# Patient Record
Sex: Female | Born: 1970
Health system: Southern US, Community
[De-identification: ages and names within clinical notes are randomized; demographics above are authoritative.]

---

## 2001-07-05 ENCOUNTER — Encounter: Admission: RE | Admit: 2001-07-05 | Discharge: 2001-08-07 | Payer: Self-pay | Admitting: Internal Medicine

## 2001-12-12 ENCOUNTER — Other Ambulatory Visit: Admission: RE | Admit: 2001-12-12 | Discharge: 2001-12-12 | Payer: Self-pay | Admitting: Obstetrics and Gynecology

## 2003-01-19 ENCOUNTER — Other Ambulatory Visit: Admission: RE | Admit: 2003-01-19 | Discharge: 2003-01-19 | Payer: Self-pay | Admitting: Obstetrics and Gynecology

## 2004-04-29 ENCOUNTER — Other Ambulatory Visit: Admission: RE | Admit: 2004-04-29 | Discharge: 2004-04-29 | Payer: Self-pay | Admitting: Obstetrics and Gynecology

## 2004-06-10 ENCOUNTER — Ambulatory Visit: Payer: Self-pay | Admitting: Internal Medicine

## 2005-06-15 ENCOUNTER — Other Ambulatory Visit: Admission: RE | Admit: 2005-06-15 | Discharge: 2005-06-15 | Payer: Self-pay | Admitting: Obstetrics and Gynecology

## 2005-08-31 ENCOUNTER — Ambulatory Visit: Payer: Self-pay | Admitting: Internal Medicine

## 2005-09-14 ENCOUNTER — Ambulatory Visit: Payer: Self-pay | Admitting: Internal Medicine

## 2005-10-23 ENCOUNTER — Ambulatory Visit: Payer: Self-pay | Admitting: Internal Medicine

## 2006-12-10 ENCOUNTER — Emergency Department (HOSPITAL_COMMUNITY): Admission: EM | Admit: 2006-12-10 | Discharge: 2006-12-10 | Payer: Self-pay | Admitting: Family Medicine

## 2007-02-18 DIAGNOSIS — E785 Hyperlipidemia, unspecified: Secondary | ICD-10-CM

## 2007-02-18 DIAGNOSIS — R519 Headache, unspecified: Secondary | ICD-10-CM | POA: Insufficient documentation

## 2007-02-18 DIAGNOSIS — F329 Major depressive disorder, single episode, unspecified: Secondary | ICD-10-CM

## 2007-02-18 DIAGNOSIS — R51 Headache: Secondary | ICD-10-CM | POA: Insufficient documentation

## 2013-07-09 ENCOUNTER — Other Ambulatory Visit: Payer: Self-pay | Admitting: Obstetrics and Gynecology

## 2013-07-09 DIAGNOSIS — R928 Other abnormal and inconclusive findings on diagnostic imaging of breast: Secondary | ICD-10-CM

## 2013-07-18 ENCOUNTER — Ambulatory Visit
Admission: RE | Admit: 2013-07-18 | Discharge: 2013-07-18 | Disposition: A | Discharge status: Home / Self Care | Payer: BC Managed Care – PPO | Source: Ambulatory Visit | Attending: Obstetrics and Gynecology | Admitting: Obstetrics and Gynecology

## 2013-07-18 DIAGNOSIS — R928 Other abnormal and inconclusive findings on diagnostic imaging of breast: Secondary | ICD-10-CM

## 2014-10-03 IMAGING — MG MM DIAGNOSTIC UNILATERAL L
2 series · 2 of 2 positions shown · non-contrast
Comparison: Previous examinations, including the screening
mammogram dated 07/02/2013.

CLINICAL DATA: Possible left breast mass at recent screening
mammography.

EXAM:
DIGITAL DIAGNOSTIC  LEFT MAMMOGRAM
ULTRASOUND LEFT BREAST

[L CC]
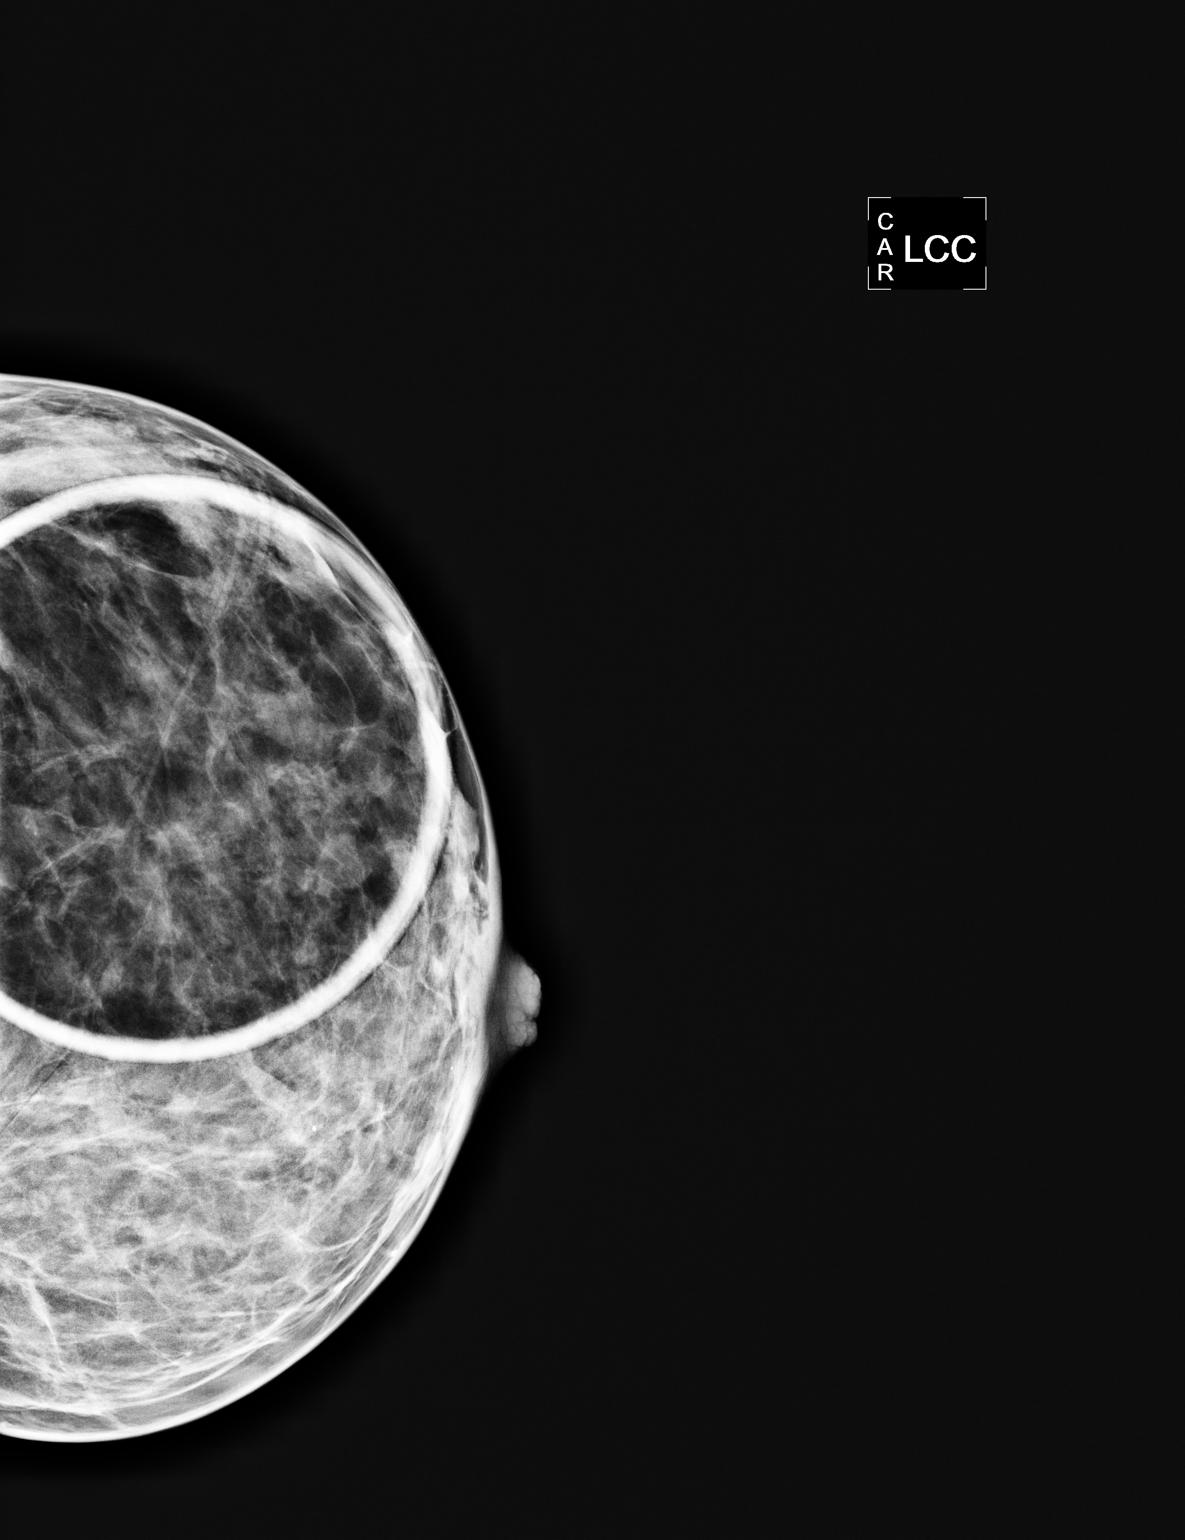

[L MLO]
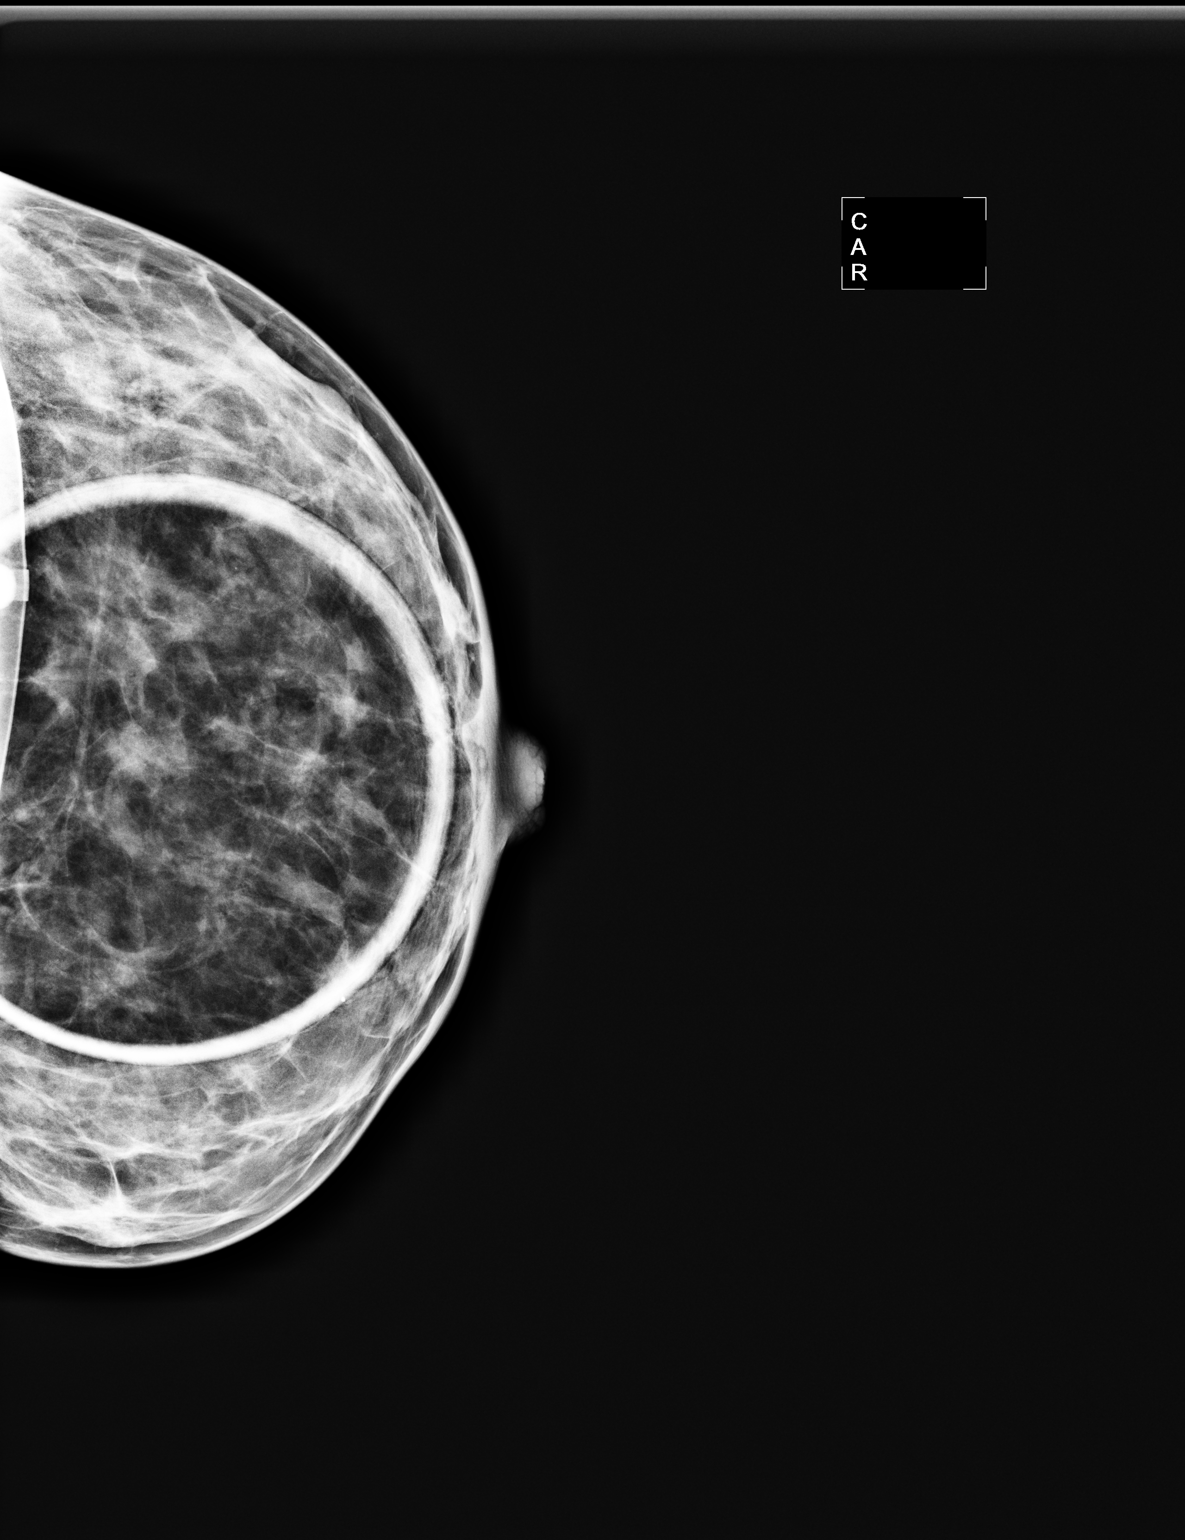

[2 of 2 positions shown; findings below may reference images not displayed]

ACR Breast Density Category c: The breast tissue is heterogeneously
dense, which may obscure small masses.
FINDINGS: Spot compression views of the left breast demonstrate normal
appearing fibroglandular tissue at the location of the recently
suspected mass in the lower outer breast.

On physical exam, no mass is palpable in the lower outer left
breast.

Ultrasound is performed, showing a 7 x 5 x 3 mm normal appearing
intramammary lymph node in the 4 o'clock position of the left
breast, 2 cm from the nipple. This corresponds to the recently
suspected mass seen on the screening mammogram.
IMPRESSION: Normal left breast intramammary lymph node. No evidence of
malignancy.

RECOMMENDATION:
Bilateral screening mammogram in 1 year.

I have discussed the findings and recommendations with the patient.
Results were also provided in writing at the conclusion of the
visit. If applicable, a reminder letter will be sent to the patient
regarding the next appointment.

BI-RADS CATEGORY  2: Benign finding(s).

## 2014-10-03 IMAGING — US US BREAST*L* LIMITED INC AXILLA
1 series · 6 of 6 positions shown · non-contrast
Comparison: Previous examinations, including the screening
mammogram dated 07/02/2013.

CLINICAL DATA: Possible left breast mass at recent screening
mammography.

EXAM:
DIGITAL DIAGNOSTIC  LEFT MAMMOGRAM
ULTRASOUND LEFT BREAST

[Series 1: us breast*left* limited inc axilla · 6 of 6 slices shown]
[im 1/6]
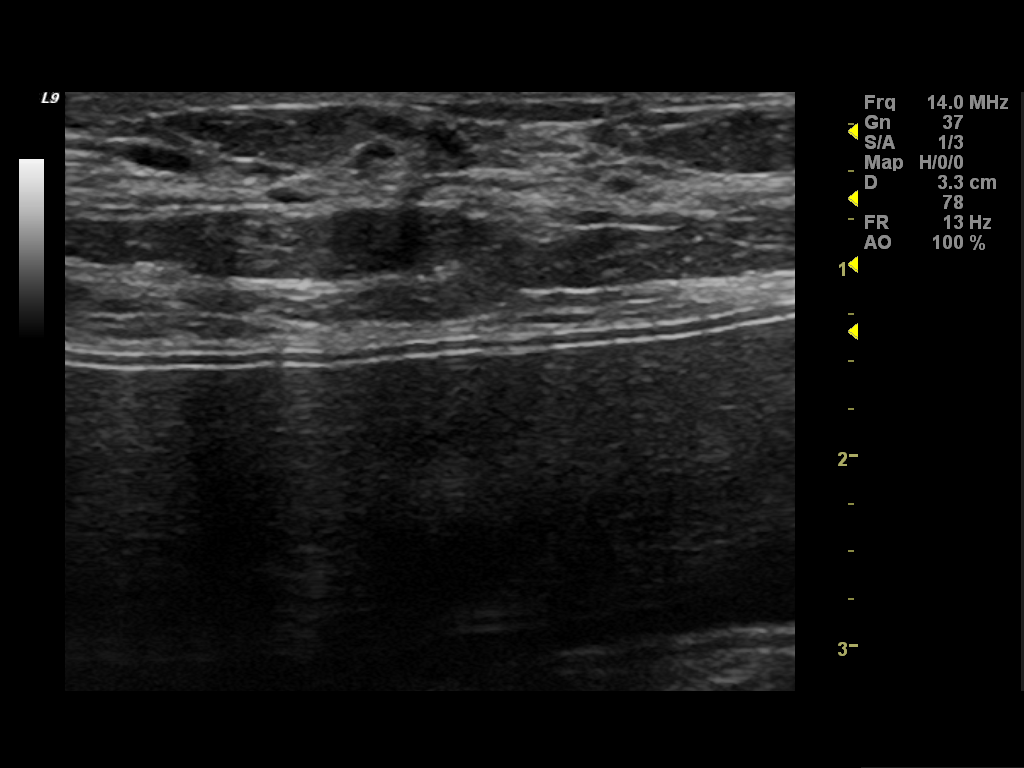
[im 2/6]
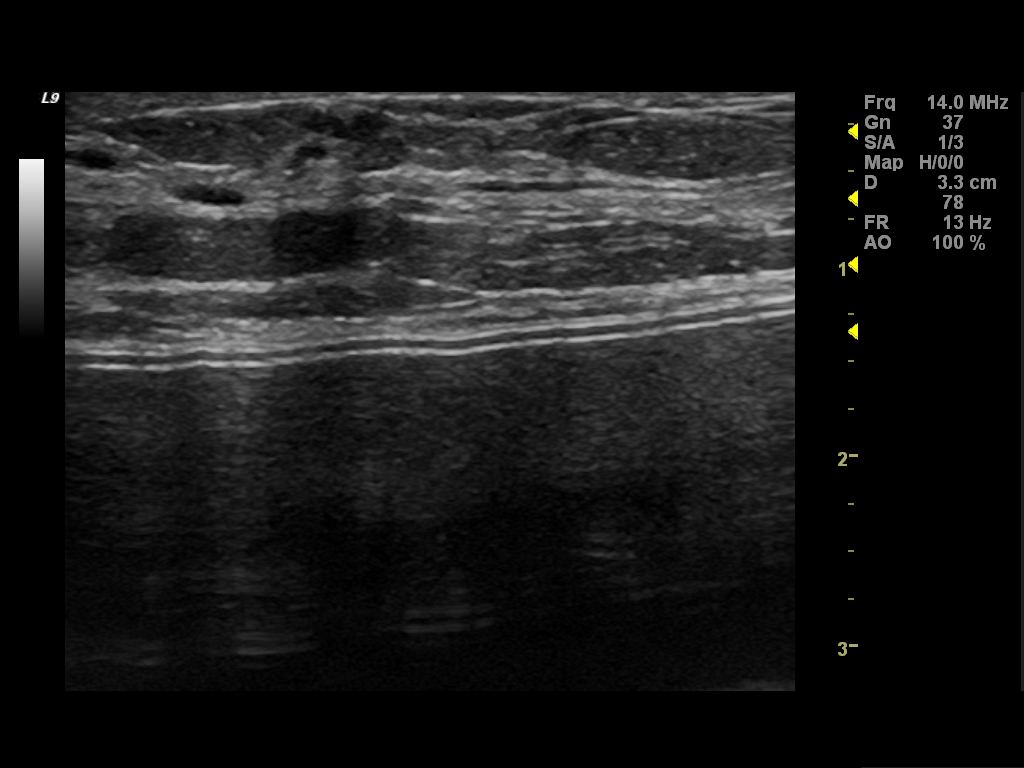
[im 3/6]
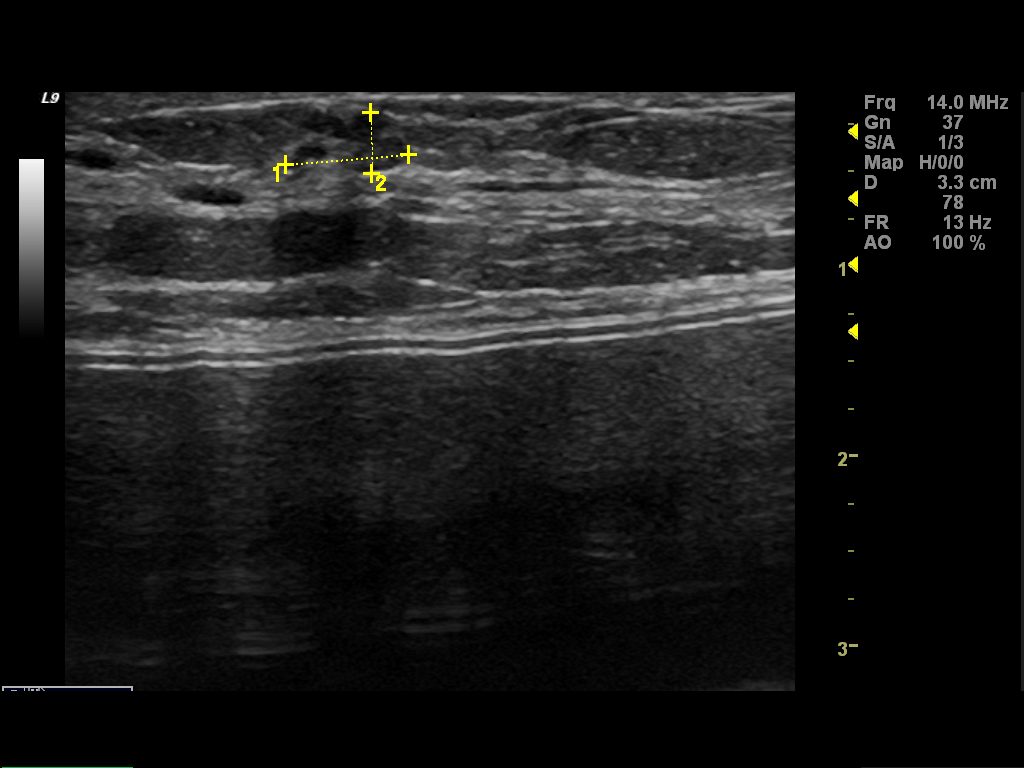
[im 4/6]
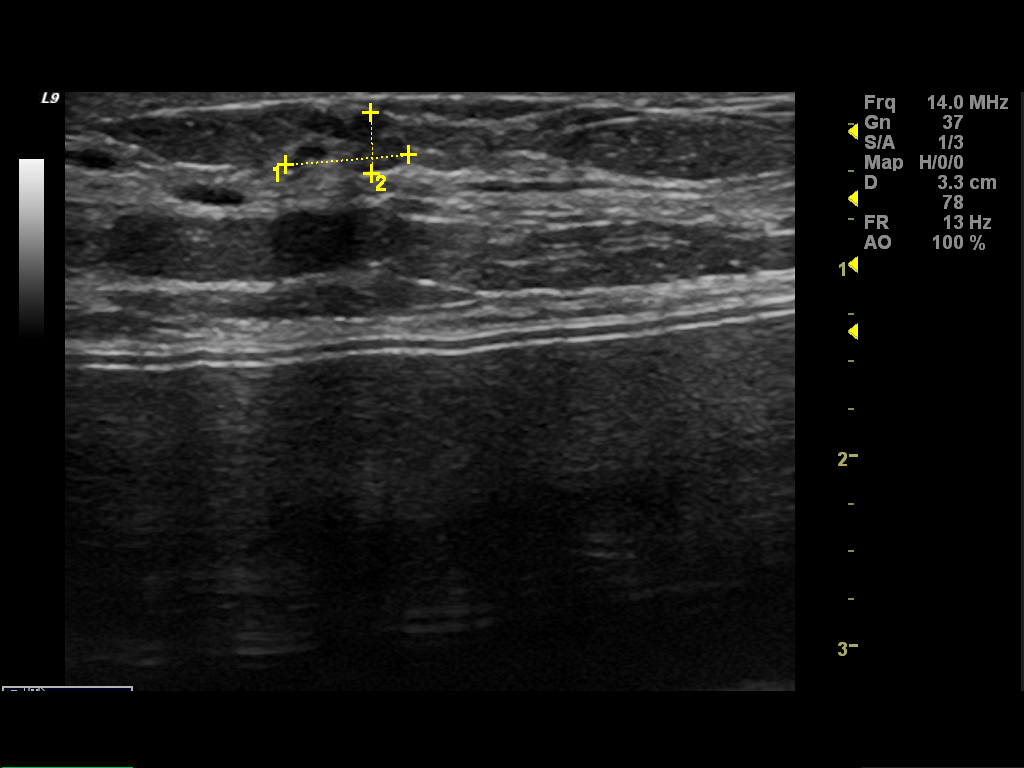
[im 5/6]
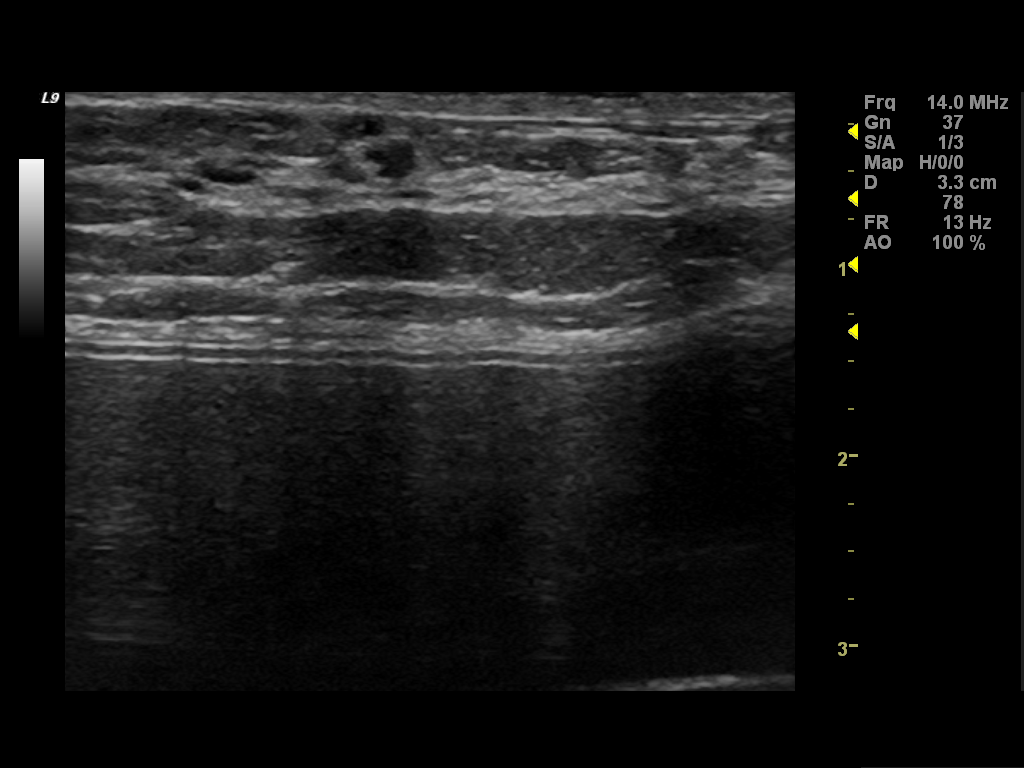
[im 6/6]
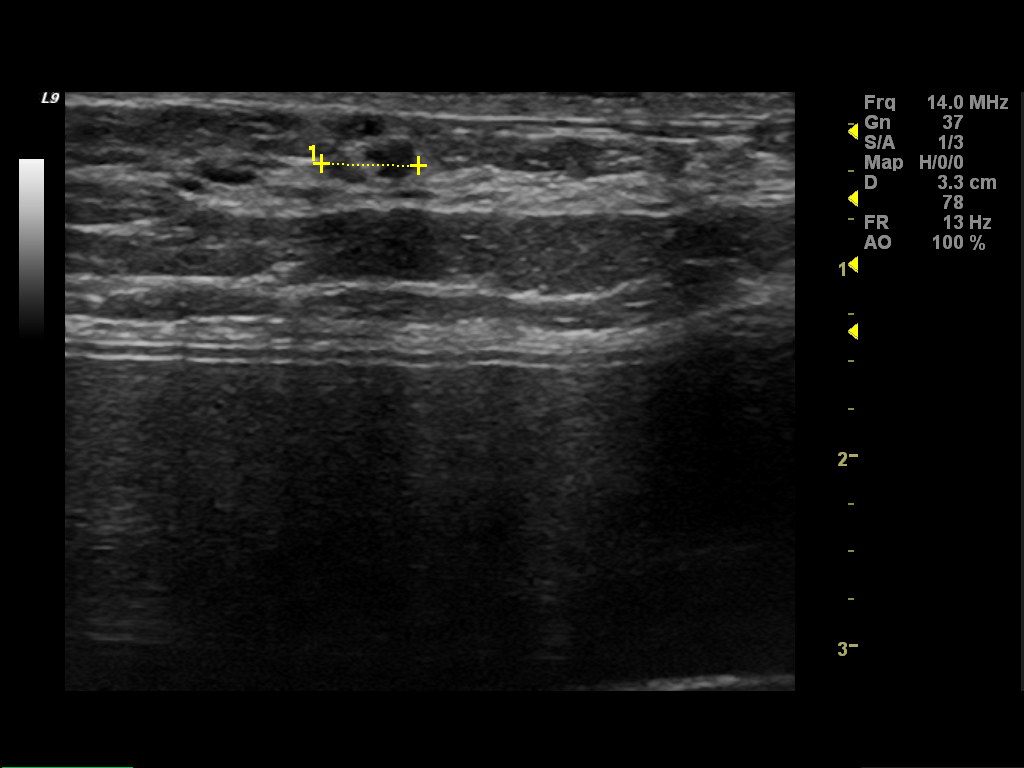

[6 of 6 positions shown; findings below may reference images not displayed]

ACR Breast Density Category c: The breast tissue is heterogeneously
dense, which may obscure small masses.
FINDINGS: Spot compression views of the left breast demonstrate normal
appearing fibroglandular tissue at the location of the recently
suspected mass in the lower outer breast.

On physical exam, no mass is palpable in the lower outer left
breast.

Ultrasound is performed, showing a 7 x 5 x 3 mm normal appearing
intramammary lymph node in the 4 o'clock position of the left
breast, 2 cm from the nipple. This corresponds to the recently
suspected mass seen on the screening mammogram.
IMPRESSION: Normal left breast intramammary lymph node. No evidence of
malignancy.

RECOMMENDATION:
Bilateral screening mammogram in 1 year.

I have discussed the findings and recommendations with the patient.
Results were also provided in writing at the conclusion of the
visit. If applicable, a reminder letter will be sent to the patient
regarding the next appointment.

BI-RADS CATEGORY  2: Benign finding(s).

## 2018-05-06 DIAGNOSIS — J209 Acute bronchitis, unspecified: Secondary | ICD-10-CM | POA: Diagnosis not present

## 2018-06-14 DIAGNOSIS — Z6824 Body mass index (BMI) 24.0-24.9, adult: Secondary | ICD-10-CM | POA: Diagnosis not present

## 2018-06-14 DIAGNOSIS — Z1231 Encounter for screening mammogram for malignant neoplasm of breast: Secondary | ICD-10-CM | POA: Diagnosis not present

## 2018-06-14 DIAGNOSIS — Z01419 Encounter for gynecological examination (general) (routine) without abnormal findings: Secondary | ICD-10-CM | POA: Diagnosis not present

## 2018-07-22 DIAGNOSIS — J309 Allergic rhinitis, unspecified: Secondary | ICD-10-CM | POA: Diagnosis not present

## 2023-05-02 ENCOUNTER — Encounter: Payer: Self-pay | Admitting: Obstetrics and Gynecology

## 2023-05-03 ENCOUNTER — Other Ambulatory Visit: Payer: Self-pay | Admitting: Obstetrics and Gynecology

## 2023-05-03 DIAGNOSIS — Z87891 Personal history of nicotine dependence: Secondary | ICD-10-CM

## 2023-05-28 ENCOUNTER — Encounter: Payer: Self-pay | Admitting: Obstetrics and Gynecology

## 2023-06-05 ENCOUNTER — Other Ambulatory Visit: Payer: Self-pay

## 2023-06-26 ENCOUNTER — Ambulatory Visit
Admission: RE | Admit: 2023-06-26 | Discharge: 2023-06-26 | Disposition: A | Discharge status: Home / Self Care | Payer: Self-pay | Source: Ambulatory Visit | Attending: Obstetrics and Gynecology | Admitting: Obstetrics and Gynecology

## 2023-06-26 DIAGNOSIS — Z87891 Personal history of nicotine dependence: Secondary | ICD-10-CM
